# Patient Record
Sex: Male | Born: 1990
Health system: Southern US, Community
[De-identification: ages and names within clinical notes are randomized; demographics above are authoritative.]

---

## 2016-06-23 DIAGNOSIS — Y999 Unspecified external cause status: Secondary | ICD-10-CM | POA: Insufficient documentation

## 2016-06-23 DIAGNOSIS — Y939 Activity, unspecified: Secondary | ICD-10-CM | POA: Insufficient documentation

## 2016-06-23 DIAGNOSIS — S39012A Strain of muscle, fascia and tendon of lower back, initial encounter: Secondary | ICD-10-CM | POA: Insufficient documentation

## 2016-06-23 DIAGNOSIS — Y92410 Unspecified street and highway as the place of occurrence of the external cause: Secondary | ICD-10-CM | POA: Diagnosis not present

## 2016-06-23 DIAGNOSIS — S161XXA Strain of muscle, fascia and tendon at neck level, initial encounter: Secondary | ICD-10-CM | POA: Insufficient documentation

## 2016-06-23 DIAGNOSIS — S3992XA Unspecified injury of lower back, initial encounter: Secondary | ICD-10-CM | POA: Diagnosis present

## 2016-06-24 ENCOUNTER — Emergency Department (HOSPITAL_COMMUNITY): Payer: No Typology Code available for payment source

## 2016-06-24 ENCOUNTER — Emergency Department (HOSPITAL_COMMUNITY)
Admission: EM | Admit: 2016-06-24 | Discharge: 2016-06-24 | Disposition: A | Discharge status: Home / Self Care | Payer: No Typology Code available for payment source | Attending: Emergency Medicine | Admitting: Emergency Medicine

## 2016-06-24 ENCOUNTER — Encounter (HOSPITAL_COMMUNITY): Payer: Self-pay

## 2016-06-24 DIAGNOSIS — S39012A Strain of muscle, fascia and tendon of lower back, initial encounter: Secondary | ICD-10-CM

## 2016-06-24 DIAGNOSIS — S161XXA Strain of muscle, fascia and tendon at neck level, initial encounter: Secondary | ICD-10-CM

## 2016-06-24 MED ORDER — IBUPROFEN 600 MG PO TABS: 600.0000 mg | ORAL_TABLET | Freq: Four times a day (QID) | ORAL | 0 refills | Status: AC | PRN

## 2016-06-24 MED ORDER — IBUPROFEN 200 MG PO TABS
600.0000 mg | ORAL_TABLET | Freq: Once | ORAL | Status: AC
  Administered 2016-06-24: 01:00:00 600 mg via ORAL
  Filled 2016-06-24: qty 1

## 2016-06-24 MED ORDER — METHOCARBAMOL 500 MG PO TABS: 500.0000 mg | ORAL_TABLET | Freq: Two times a day (BID) | ORAL | 0 refills | Status: AC

## 2016-06-24 MED ORDER — METHOCARBAMOL 500 MG PO TABS
750.0000 mg | ORAL_TABLET | Freq: Once | ORAL | Status: AC
  Administered 2016-06-24: 750 mg via ORAL
  Filled 2016-06-24: qty 2
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
## 2016-06-24 NOTE — ED Notes (Signed)
Patient transported to X-ray 

## 2016-06-24 NOTE — Discharge Instructions (Signed)
Your x-ray is normal.  He been given 2 prescriptions, one for pain control and control of inflammation as well as one for muscle relaxation.  Please take these as directed.  Also been given instructions on using ice to the area.  He can expect to be uncomfortable and sore for 7-10 days if you develop new or worsening symptoms, return for further evaluation
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
## 2020-04-06 ENCOUNTER — Other Ambulatory Visit: Payer: Self-pay

## 2020-04-06 DIAGNOSIS — Z20822 Contact with and (suspected) exposure to covid-19: Secondary | ICD-10-CM

## 2020-04-08 LAB — SARS-COV-2, NAA 2 DAY TAT

## 2020-04-08 LAB — NOVEL CORONAVIRUS, NAA: SARS-CoV-2, NAA: NOT DETECTED

## 2023-04-29 ENCOUNTER — Encounter (HOSPITAL_BASED_OUTPATIENT_CLINIC_OR_DEPARTMENT_OTHER): Payer: Self-pay

## 2023-04-29 ENCOUNTER — Emergency Department (HOSPITAL_BASED_OUTPATIENT_CLINIC_OR_DEPARTMENT_OTHER)
Admission: EM | Admit: 2023-04-29 | Discharge: 2023-04-29 | Disposition: A | Discharge status: Home / Self Care | Payer: BC Managed Care – PPO | Attending: Emergency Medicine | Admitting: Emergency Medicine

## 2023-04-29 ENCOUNTER — Other Ambulatory Visit: Payer: Self-pay

## 2023-04-29 DIAGNOSIS — Z20822 Contact with and (suspected) exposure to covid-19: Secondary | ICD-10-CM | POA: Diagnosis not present

## 2023-04-29 DIAGNOSIS — Z91010 Allergy to peanuts: Secondary | ICD-10-CM | POA: Insufficient documentation

## 2023-04-29 DIAGNOSIS — J101 Influenza due to other identified influenza virus with other respiratory manifestations: Secondary | ICD-10-CM

## 2023-04-29 DIAGNOSIS — R000 Tachycardia, unspecified: Secondary | ICD-10-CM | POA: Diagnosis not present

## 2023-04-29 DIAGNOSIS — J45909 Unspecified asthma, uncomplicated: Secondary | ICD-10-CM | POA: Diagnosis not present

## 2023-04-29 DIAGNOSIS — J09X2 Influenza due to identified novel influenza A virus with other respiratory manifestations: Secondary | ICD-10-CM | POA: Insufficient documentation

## 2023-04-29 DIAGNOSIS — R059 Cough, unspecified: Secondary | ICD-10-CM | POA: Diagnosis present

## 2023-04-29 LAB — RESP PANEL BY RT-PCR (RSV, FLU A&B, COVID)  RVPGX2
Influenza A by PCR: POSITIVE — AB
Influenza B by PCR: NEGATIVE
Resp Syncytial Virus by PCR: NEGATIVE
SARS Coronavirus 2 by RT PCR: NEGATIVE

## 2023-04-29 MED ORDER — ALBUTEROL SULFATE HFA 108 (90 BASE) MCG/ACT IN AERS
2.0000 | INHALATION_SPRAY | RESPIRATORY_TRACT | Status: DC | PRN
  Administered 2023-04-29: 2 via RESPIRATORY_TRACT
  Filled 2023-04-29: qty 6.7

## 2023-04-29 MED ORDER — ACETAMINOPHEN 500 MG PO TABS
1000.0000 mg | ORAL_TABLET | Freq: Once | ORAL | Status: AC
  Administered 2023-04-29: 1000 mg via ORAL
  Filled 2023-04-29: qty 2

## 2023-04-29 MED ORDER — ACETAMINOPHEN 500 MG PO TABS: 500.0000 mg | ORAL_TABLET | Freq: Four times a day (QID) | ORAL | 0 refills | Status: AC | PRN

## 2023-04-29 MED ORDER — BENZONATATE 100 MG PO CAPS: 100.0000 mg | ORAL_CAPSULE | Freq: Three times a day (TID) | ORAL | 0 refills | Status: AC

## 2023-04-29 NOTE — ED Triage Notes (Signed)
Flu like s/s since yesterday eve

## 2023-04-29 NOTE — ED Provider Notes (Signed)
EMERGENCY DEPARTMENT AT [facility] Provider Note   CSN: [id number] Arrival date & time: 04/29/23  2004     History  Chief Complaint  Patient presents with   Cough   Fever    Flu like s/s since yesterday eve    Matthew Hester is a 33 y.o. male.  The history is provided by the patient and medical records. No language interpreter was used.  Cough Associated symptoms: fever   Fever Associated symptoms: cough      33 year old male present with flulike symptoms.  Patient states since last night he has had fever, chills, body aches, headache, congestion, sneezing, coughing, decrease in appetite.  He tries taking over-the-counter Tylenol and ibuprofen but report no improvement.  He does endorse having some wheezes.  Does have history of asthma but has not required any breathing treatment for quite a while.  He works in the [employer] and was exposed to sick contact.  Home Medications Prior to Admission medications   Medication Sig Start Date End Date Taking? Authorizing Provider  ibuprofen (ADVIL,MOTRIN) 600 MG tablet Take 1 tablet (600 mg total) by mouth every 6 (six) hours as needed. 06/24/16   Earley Favor, NP  methocarbamol (ROBAXIN) 500 MG tablet Take 1 tablet (500 mg total) by mouth 2 (two) times daily. 06/24/16   Earley Favor, NP      Allergies    Peanut-containing drug products    Review of Systems   Review of Systems  Constitutional:  Positive for fever.  Respiratory:  Positive for cough.   All other systems reviewed and are negative.   Physical Exam Updated Vital Signs BP (!) 145/86 (BP Location: Right Arm)   Pulse (!) 107   Temp (!) 101 F (38.3 C) (Oral)   Resp 20   Ht 5\' 9"  (1.753 m)   Wt 106.6 kg   SpO2 95%   BMI 34.70 kg/m  Physical Exam Vitals and nursing note reviewed.  Constitutional:      General: He is not in acute distress.    Appearance: He is well-developed.  HENT:     Head: Atraumatic.     Mouth/Throat:      Mouth: Mucous membranes are moist.  Eyes:     Conjunctiva/sclera: Conjunctivae normal.  Cardiovascular:     Rate and Rhythm: Tachycardia present.     Pulses: Normal pulses.     Heart sounds: Normal heart sounds.  Pulmonary:     Effort: Pulmonary effort is normal.     Breath sounds: Wheezing present. No rhonchi or rales.  Abdominal:     Palpations: Abdomen is soft.     Tenderness: There is no abdominal tenderness.  Musculoskeletal:        General: Normal range of motion.     Cervical back: Neck supple.  Skin:    Findings: No rash.  Neurological:     Mental Status: He is alert and oriented to person, place, and time.     ED Results / Procedures / Treatments   Labs (all labs ordered are listed, but only abnormal results are displayed) Labs Reviewed  RESP PANEL BY RT-PCR (RSV, FLU A&B, COVID)  [id number] - Abnormal; Notable for the following components:      Result Value   Influenza A by PCR POSITIVE (*)    All other components within normal limits    EKG None  Radiology No results found.  Procedures Procedures    Medications Ordered in ED Medications  albuterol (  VENTOLIN HFA) 108 (90 Base) MCG/ACT inhaler 2 puff (2 puffs Inhalation Given 04/29/23 2212)  acetaminophen (TYLENOL) tablet 1,000 mg (1,000 mg Oral Given 04/29/23 2038)    ED Course/ Medical Decision Making/ A&P                                 Medical Decision Making Risk OTC drugs.   BP (!) 145/86 (BP Location: Right Arm)   Pulse (!) 107   Temp (!) 101 F (38.3 C) (Oral)   Resp 20   Ht 5\' 9"  (1.753 m)   Wt 106.6 kg   SpO2 95%   BMI 34.70 kg/m   49:84 PM  33 year old male present with flulike symptoms.  Patient states since last night he has had fever, chills, body aches, headache, congestion, sneezing, coughing, decrease in appetite.  He tries taking over-the-counter Tylenol and ibuprofen but report no improvement.  He does endorse having some wheezes.  Does have history of asthma but has not  required any breathing treatment for quite a while.  He works in the [employer] and was exposed to sick contact.  Exam notable for faint expiratory wheezes but lungs otherwise clear.  No rales or rhonchi heard.  Patient overall well-appearing, speaking in complete sentences, resting comfortably in the chair.  Vital signs notable for oral temperature of 101.  Patient is tachycardic with a heart rate of 107.  No hypoxia no hypotension.  -Labs ordered, independently viewed and interpreted by me.  Labs remarkable for influenza A positive -The patient was maintained on a cardiac monitor.  I personally viewed and interpreted the cardiac monitored which showed an underlying rhythm of: Sinus tachycardia -Imaging including chest x-ray considered however symptoms likely related to influenza and less likely to be pneumonia -This patient presents to the ED for concern of flulike symptoms, this involves an extensive number of treatment options, and is a complaint that carries with it a high risk of complications and morbidity.  The differential diagnosis includes COVID, flu, RSV, pneumonia, viral illness, asthma exacerbation -Co morbidities that complicate the patient evaluation includes asthma -Treatment includes albuterol inhaler, Tylenol -Reevaluation of the patient after these medicines showed that the patient improved -PCP office notes or outside notes reviewed -Escalation to admission/observation considered: patients feels much better, is comfortable with discharge, and will follow up with PCP -Prescription medication considered, patient comfortable with tessalon, tylenol -Social Determinant of Health considered which includes tobacco use.          Final Clinical Impression(s) / ED Diagnoses Final diagnoses:  Influenza A    Rx / DC Orders ED Discharge Orders          Ordered    acetaminophen (TYLENOL) 500 MG tablet  Every 6 hours PRN        04/29/23 2252    benzonatate (TESSALON) 100  MG capsule  Every 8 hours        04/29/23 2252              Fayrene Helper, PA-C 04/29/23 2253    Anders Simmonds T, DO 04/30/23 2239

## 2023-04-29 NOTE — ED Notes (Signed)
Pt discharged at this time w paperwork, no questions at time of discharge. Registration in pt's chart at time of discharge, pt pulled OTF until able to discharge

## 2023-04-29 NOTE — Discharge Instructions (Addendum)
You have been evaluated for your symptoms.  You have tested positive for influenza A.  Please take Tylenol and or ibuprofen as needed for aches and pains and fever.  Take Tessalon for cough.  Stay hydrated by drinking plenty of fluid.  Return if you have other concern.
